# Patient Record
Sex: Female | Born: 2005 | Race: White | Hispanic: No | Marital: Single | Smoking: Never smoker
Health system: Southern US, Community
[De-identification: ages and names within clinical notes are randomized; demographics above are authoritative.]

## PROBLEM LIST (undated history)

## (undated) DIAGNOSIS — J302 Other seasonal allergic rhinitis: Secondary | ICD-10-CM

## (undated) DIAGNOSIS — S52202A Unspecified fracture of shaft of left ulna, initial encounter for closed fracture: Secondary | ICD-10-CM

## (undated) DIAGNOSIS — Z8489 Family history of other specified conditions: Secondary | ICD-10-CM

## (undated) DIAGNOSIS — J189 Pneumonia, unspecified organism: Secondary | ICD-10-CM

## (undated) DIAGNOSIS — Z9889 Other specified postprocedural states: Secondary | ICD-10-CM

## (undated) DIAGNOSIS — F419 Anxiety disorder, unspecified: Secondary | ICD-10-CM

## (undated) DIAGNOSIS — R112 Nausea with vomiting, unspecified: Secondary | ICD-10-CM

## (undated) HISTORY — PX: TONSILLECTOMY: SUR1361

## (undated) HISTORY — PX: MOUTH SURGERY: SHX715

---
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
## 2024-01-21 ENCOUNTER — Ambulatory Visit: Admitting: Physician Assistant

## 2024-01-21 ENCOUNTER — Other Ambulatory Visit: Payer: Self-pay

## 2024-01-21 ENCOUNTER — Ambulatory Visit (INDEPENDENT_AMBULATORY_CARE_PROVIDER_SITE_OTHER)
Admission: RE | Admit: 2024-01-21 | Discharge: 2024-01-21 | Disposition: A | Discharge status: Home / Self Care | Payer: Self-pay | Source: Ambulatory Visit | Attending: Physician Assistant | Admitting: Physician Assistant

## 2024-01-21 ENCOUNTER — Encounter: Payer: Self-pay | Admitting: Physician Assistant

## 2024-01-21 VITALS — BP 100/68 | HR 64 | Ht 63.0 in | Wt 104.0 lb

## 2024-01-21 DIAGNOSIS — G8929 Other chronic pain: Secondary | ICD-10-CM | POA: Diagnosis not present

## 2024-01-21 DIAGNOSIS — R112 Nausea with vomiting, unspecified: Secondary | ICD-10-CM | POA: Diagnosis not present

## 2024-01-21 DIAGNOSIS — R109 Unspecified abdominal pain: Secondary | ICD-10-CM | POA: Diagnosis not present

## 2024-01-21 DIAGNOSIS — R110 Nausea: Secondary | ICD-10-CM | POA: Diagnosis not present

## 2024-01-21 DIAGNOSIS — R1013 Epigastric pain: Secondary | ICD-10-CM

## 2024-01-21 MED ORDER — HYOSCYAMINE SULFATE 0.125 MG SL SUBL: 0.1250 mg | SUBLINGUAL_TABLET | Freq: Four times a day (QID) | SUBLINGUAL | 1 refills | Status: DC | PRN

## 2024-01-21 MED ORDER — ONDANSETRON HCL 4 MG PO TABS: 4.0000 mg | ORAL_TABLET | Freq: Three times a day (TID) | ORAL | 1 refills | Status: DC | PRN

## 2024-01-21 MED ORDER — OMEPRAZOLE 40 MG PO CPDR: 40.0000 mg | DELAYED_RELEASE_CAPSULE | Freq: Every day | ORAL | 0 refills | Status: DC

## 2024-01-21 NOTE — Patient Instructions (Addendum)
 Your provider has requested that you go to the basement level for lab work before leaving today. Press B on the elevator. The lab is located at the first door on the left as you exit the elevator.  Either before or after the lab - go to [facility] to have an xray done.  We are ordering a Diatherix stool testing for you to take home and complete.  You have received a kit from our office today containing all necessary supplies to complete this test.  Please carefully read the stool collection instructions provided in the kit before opening the accompanying materials  OR an easier way is to please use toilet paper to wipe after your bowel movement and use the qtip/applicator provided to get a small volume of the stool from the toilet paper and place that in the tube.   Important to remember: -Place the label onto the puritan opti-swab TUBE. -This label should include your full name and date of birth.  - This label should have the DATE AND TIME stool was collects -After completing the test, you should secure the tube into the specimen biohazard bag.  -The laboratory request information sheet (including date and time of specimen collection) should be placed into the outside pocket of the specimen biohazard bag and returned to the [place] lab with 2 days of collection.   If it is greater than two days from collection you will be asked to repeat the test.  If the label is missing from the tube with your name, date of birth, date and time of collection on it, you will have to repeat the test.  Any questions please message us  on my chart or call the office at [phone]   Your provider has requested that you have an abdominal x ray before leaving today. Please go to the basement floor to our Radiology department for the test.  Please take your proton pump inhibitor medication, omeprazole  40 mg  Please take this medication 30 minutes to 1 hour before meals- this makes it more effective.  Avoid  spicy and acidic foods Avoid fatty foods Limit your intake of coffee, tea, alcohol, and carbonated drinks Work to maintain a healthy weight Keep the head of the bed elevated at least 3 inches with blocks or a wedge pillow if you are having any nighttime symptoms Stay upright for 2 hours after eating Avoid meals and snacks three to four hours before bedtime  First do a trial off milk/lactose products if you use them.  Add fiber like benefiber or citracel once a day Increase activity Can do trial of IBGard which is over the counter for AB pain- Take 1-2 capsules once a day for maintence or twice a day during a flare Can send in an anti spasm medication, Levsin , to take as needed for AB pain    FODMAP stands for fermentable oligo-, di-, mono-saccharides and polyols (1). These are the scientific terms used to classify groups of carbs that are difficult for our body to digest and that are notorious for triggering digestive symptoms like bloating, gas, loose stools and stomach pain.   You can try low FODMAP diet  - start with eliminating just one column at a time that you feel may be a trigger for you. - the table at the very bottom contains foods that are low in FODMAPs   Sometimes trying to eliminate the FODMAP's from your diet is difficult or tricky, if you are stuggling with trying to do  the elimination diet you can try an enzyme.  There is a food enzymes that you sprinkle in or on your food that helps break down the FODMAP. You can read more about the enzyme by going to this site: https://fodzyme.com/

## 2024-01-21 NOTE — Progress Notes (Signed)
 01/21/2024 Angelica Campos [id number] 01-Sep-2005  Referring provider: Joaquim Lorenza NOVAK, MD Primary GI doctor: Dr. Suzann  ASSESSMENT AND PLAN:  Epigastric pain  progressively over the last year, nausea with some vomiting stomach acid, worse in the morning, decreased appetite, no fever, chills, melena, change in bowel habits 10/2022 unremarkable CBC, kidney liver, sed rate CRP, thyroid -Get Sed rate, CRP, celiac panel -Check H. Pylori Diatherix -Get lipase - KUB -Will schedule for AB US  -Pending labs and response to medication, can consider endoscopic evaluation -Consider SIBO testing or xifaxin trial pending results -Can do trial of IBGARD daily, will give Levsin  -FODMAP,  and lifestyle changes discussed - close follow up 4-6 weeks  Patient Care Team: Dial, Lorenza NOVAK, MD as PCP - General (Pediatrics)   HISTORY OF PRESENT ILLNESS: 18 y.o. female with a past medical history listed below presents for evaluation of abdominal pain.   Discussed the use of AI scribe software for clinical note transcription with the patient, who gave verbal consent to proceed.  The patient presents with a year-long history of worsening abdominal pain and nausea.  The patient has experienced abdominal pain for the past year, which has progressively worsened, particularly in the last few days. The pain is located under the rib cage, above the belly button, and does not radiate. It is described as sharp and uncomfortable, often waking them up in the early morning hours, around 4 AM. The pain is alleviated by sitting up or lying on their back, but worsens when lying on their sides.  Associated symptoms include nausea and vomiting, primarily in the morning, often vomiting stomach acid. They report a decreased appetite, particularly in the morning, and feel better after eating in the afternoon, although they find food uninteresting. They are unsure about recent weight changes, as they do not have a recent weight  measurement to confirm this. No fever, chills, changes in bowel habits, or blood in the stool. They have regular bowel movements, occurring daily or every other day, without straining or loose stools. They do not experience acid reflux symptoms such as burning or acid sensation in the esophagus, but they do have sharp abdominal pain.  Their medication use includes taking ibuprofen  for headaches every two to four days, usually three tablets at a time. They have tried over-the-counter Pepcid, which was ineffective, but report that omeprazole  40 mg helps alleviate their symptoms. They have also used Zofran  for nausea.  They work as a Production assistant, radio at [employer] and typically eat late at night after work. They do not smoke or consume alcohol. There is no history of migraines or vertigo, and they deny any chance of pregnancy.   She  reports that she has never smoked. She has never used smokeless tobacco. She reports that she does not drink alcohol and does not use drugs.  RELEVANT GI HISTORY, IMAGING AND LABS: Results          CBC No results found for: WBC, RBC, HGB, HCT, PLT, MCV, MCH, MCHC, RDW, LYMPHSABS, MONOABS, EOSABS, BASOSABS No results for input(s): HGB in the last 8760 hours.  CMP  No results found for: NA, K, CL, CO2, GLUCOSE, BUN, CREATININE, CALCIUM, PROT, ALBUMIN, AST, ALT, ALKPHOS, BILITOT, GFRNONAA, GFRAA     No data to display            Current Medications:   Current Outpatient Medications (Endocrine & Metabolic):    drospirenone-ethinyl estradiol (YAZ) 3-0.02 MG tablet, Take 1 tablet by mouth daily.  Current Outpatient Medications (Analgesics):    ibuprofen  (ADVIL ,MOTRIN ) 400 MG tablet, Take 400 mg by mouth 3 (three) times daily.   Current Outpatient Medications (Other):    escitalopram (LEXAPRO) 10 MG tablet, Take 1 tablet by mouth daily.   hyoscyamine  (LEVSIN  SL) 0.125 MG SL tablet, Place 1 tablet (0.125 mg  total) under the tongue every 6 (six) hours as needed for cramping (nausea).   omeprazole  (PRILOSEC) 40 MG capsule, Take 1 capsule (40 mg total) by mouth daily before breakfast.   ondansetron  (ZOFRAN ) 4 MG tablet, Take 1 tablet (4 mg total) by mouth every 8 (eight) hours as needed for nausea or vomiting.  Medical History:  Past Medical History:  Diagnosis Date   Anxiety    Family history of adverse reaction to anesthesia    Mother had difficuly urinating after surgery as well PONV   Left ulnar fracture    Pneumonia    PONV (postoperative nausea and vomiting)    Seasonal allergies    Allergies: No Known Allergies   Surgical History:  She  has a past surgical history that includes Tonsillectomy; Mouth surgery; and ORIF ulnar fracture (Left, 07/29/2016). Family History:  Her family history includes Arthritis in her maternal grandfather and mother.  REVIEW OF SYSTEMS  : All other systems reviewed and negative except where noted in the History of Present Illness.  PHYSICAL EXAM: BP 100/68   Pulse 64   Ht 5' 3 (1.6 m)   Wt 104 lb (47.2 kg)   BMI 18.42 kg/m  GENERAL APPEARANCE: Well nourished, in no apparent distress. HEENT: No cervical lymphadenopathy, unremarkable thyroid, sclerae anicteric, conjunctiva pink. RESPIRATORY: Respiratory effort normal, BS equal bilateral without rales, rhonchi, wheezing. CARDIO: RRR with no MRGs, peripheral pulses intact. ABDOMEN: Soft, non distended, active bowel sounds in all 4 quadrants, no tenderness to palpation, no rebound, no mass appreciated. Abdominal pain localized below rib cage, no upper abdominal tenderness. RECTAL: Declines. MUSCULOSKELETAL: Full ROM, normal gait, without edema. SKIN: Dry, intact without rashes or lesions. No jaundice. NEURO: Alert, oriented, no focal deficits. PSYCH: Cooperative, normal mood and affect. Alan JONELLE Coombs, PA-C 2:48 PM

## 2024-01-23 ENCOUNTER — Ambulatory Visit: Payer: Self-pay | Admitting: Physician Assistant

## 2024-01-28 ENCOUNTER — Ambulatory Visit (HOSPITAL_COMMUNITY)
Admission: RE | Admit: 2024-01-28 | Discharge: 2024-01-28 | Disposition: A | Discharge status: Home / Self Care | Source: Ambulatory Visit | Attending: Physician Assistant | Admitting: Physician Assistant

## 2024-01-28 ENCOUNTER — Other Ambulatory Visit (INDEPENDENT_AMBULATORY_CARE_PROVIDER_SITE_OTHER)

## 2024-01-28 ENCOUNTER — Telehealth: Payer: Self-pay

## 2024-01-28 ENCOUNTER — Other Ambulatory Visit: Payer: Self-pay

## 2024-01-28 ENCOUNTER — Ambulatory Visit: Payer: Self-pay | Admitting: Physician Assistant

## 2024-01-28 DIAGNOSIS — R112 Nausea with vomiting, unspecified: Secondary | ICD-10-CM

## 2024-01-28 DIAGNOSIS — R109 Unspecified abdominal pain: Secondary | ICD-10-CM | POA: Diagnosis not present

## 2024-01-28 DIAGNOSIS — R1013 Epigastric pain: Secondary | ICD-10-CM | POA: Insufficient documentation

## 2024-01-28 DIAGNOSIS — E162 Hypoglycemia, unspecified: Secondary | ICD-10-CM | POA: Diagnosis not present

## 2024-01-28 DIAGNOSIS — R110 Nausea: Secondary | ICD-10-CM | POA: Diagnosis not present

## 2024-01-28 LAB — COMPREHENSIVE METABOLIC PANEL WITH GFR
ALT: 13 U/L (ref 0–35)
AST: 22 U/L (ref 0–37)
Albumin: 4.8 g/dL (ref 3.5–5.2)
Alkaline Phosphatase: 51 U/L (ref 47–119)
BUN: 15 mg/dL (ref 6–23)
CO2: 26 meq/L (ref 19–32)
Calcium: 10.1 mg/dL (ref 8.4–10.5)
Chloride: 103 meq/L (ref 96–112)
Creatinine, Ser: 0.79 mg/dL (ref 0.40–1.20)
GFR: 108.43 mL/min (ref >60.00)
Glucose, Bld: 45 mg/dL — CL (ref 70–99)
Potassium: 3.8 meq/L (ref 3.5–5.1)
Sodium: 138 meq/L (ref 135–145)
Total Bilirubin: 0.4 mg/dL (ref 0.2–1.2)
Total Protein: 7.9 g/dL (ref 6.0–8.3)

## 2024-01-28 LAB — CBC WITH DIFFERENTIAL/PLATELET
Basophils Absolute: 0.1 K/uL (ref 0.0–0.1)
Basophils Relative: 1.4 % (ref 0.0–3.0)
Eosinophils Absolute: 0.1 K/uL (ref 0.0–0.7)
Eosinophils Relative: 2.4 % (ref 0.0–5.0)
HCT: 43.5 % (ref 36.0–49.0)
Hemoglobin: 14.4 g/dL (ref 12.0–16.0)
Lymphocytes Relative: 31.6 % (ref 24.0–48.0)
Lymphs Abs: 1.5 K/uL (ref 0.7–4.0)
MCHC: 33.1 g/dL (ref 31.0–37.0)
MCV: 83.1 fl (ref 78.0–98.0)
Monocytes Absolute: 0.6 K/uL (ref 0.1–1.0)
Monocytes Relative: 11.5 % (ref 3.0–12.0)
Neutro Abs: 2.6 K/uL (ref 1.4–7.7)
Neutrophils Relative %: 53.1 % (ref 43.0–71.0)
Platelets: 288 K/uL (ref 150.0–575.0)
RBC: 5.24 Mil/uL (ref 3.80–5.70)
RDW: 13.5 % (ref 11.4–15.5)
WBC: 4.8 K/uL (ref 4.5–13.5)

## 2024-01-28 LAB — LIPASE: Lipase: 45 U/L (ref 11.0–59.0)

## 2024-01-28 LAB — C-REACTIVE PROTEIN: CRP: <1 mg/dL (ref 0.5–20.0)

## 2024-01-28 LAB — SEDIMENTATION RATE: Sed Rate: 11 mm/h (ref 0–20)

## 2024-01-28 LAB — HEMOGLOBIN A1C: Hgb A1c MFr Bld: 5.7 % (ref 4.6–6.5)

## 2024-01-28 NOTE — Telephone Encounter (Signed)
 Spoke with Pt Mother Berwyn  about recent lab results . Pt mom stated that she was really concerned due to the fact that last week during her lab draw that she became cold and clammy, hands curled up and became stiff and passed out. Berwyn stated that the pt had a small meal and orange juice before the lab draw this morning.  Pt mom Berwyn stated that today the pt  became very cold,  clammy and sweaty during the lab draw.   Orders received from [facility] PA to add on A1C, Random Insulin  and TTG. Orders placed in Epic.  Darice from the lab made aware and stated that she would notify me if they were not able to add on the labs.  Routed as FYI

## 2024-01-28 NOTE — Telephone Encounter (Signed)
 Hope from lab said that patient's glucose level was 45 at 8:57am. Please advise

## 2024-01-29 LAB — INSULIN, RANDOM: Insulin: 14.9 u[IU]/mL

## 2024-01-29 LAB — TISSUE TRANSGLUTAMINASE ABS,IGG,IGA

## 2024-01-31 LAB — IGA: Immunoglobulin A: 78 mg/dL (ref 47–310)

## 2024-02-01 DIAGNOSIS — E162 Hypoglycemia, unspecified: Secondary | ICD-10-CM | POA: Diagnosis not present

## 2024-02-01 DIAGNOSIS — Z68.41 Body mass index (BMI) pediatric, 5th percentile to less than 85th percentile for age: Secondary | ICD-10-CM | POA: Diagnosis not present

## 2024-02-01 DIAGNOSIS — R1013 Epigastric pain: Secondary | ICD-10-CM | POA: Diagnosis not present

## 2024-02-05 ENCOUNTER — Other Ambulatory Visit

## 2024-02-12 ENCOUNTER — Telehealth: Payer: Self-pay | Admitting: Physician Assistant

## 2024-02-15 DIAGNOSIS — R1013 Epigastric pain: Secondary | ICD-10-CM | POA: Diagnosis not present

## 2024-02-15 DIAGNOSIS — E162 Hypoglycemia, unspecified: Secondary | ICD-10-CM | POA: Diagnosis not present

## 2024-02-20 ENCOUNTER — Ambulatory Visit: Payer: Self-pay | Admitting: Physician Assistant

## 2024-02-20 ENCOUNTER — Encounter: Payer: Self-pay | Admitting: Physician Assistant

## 2024-02-20 NOTE — Progress Notes (Deleted)
 02/20/2024 Angelica Campos [id number] 08-20-2005  Referring provider: Joaquim Lorenza NOVAK, MD Primary GI doctor: Dr. Suzann  ASSESSMENT AND PLAN:  Epigastric pain  progressively over the last year, nausea with some vomiting stomach acid, worse in the morning, decreased appetite, no fever, chills, melena, change in bowel habits 10/2022 unremarkable CBC, kidney liver, sed rate CRP, thyroid -Get Sed rate, CRP, celiac panel -Check H. Pylori Diatherix -Get lipase - KUB -Will schedule for AB US  -Pending labs and response to medication, can consider endoscopic evaluation -Consider SIBO testing or xifaxin trial pending results -Can do trial of IBGARD daily, will give Levsin  -FODMAP,  and lifestyle changes discussed - close follow up 4-6 weeks  Hypoglycemia Following with endocrine Consider fasting glucose test, rule out insulinoma  I have reviewed the clinic note as outlined by Angelica Coombs, PA and agree with the assessment, plan and medical decision making.  Angelica Campos presents to the office for evaluation of epigastric abdominal pain, nausea and vomiting.  No other alarm features.  Agree with laboratory testing, KUB and abdominal ultrasound.  Reasonable to trial omeprazole , hyoscyamine  and dietary modification.  If symptoms are not improving and above workup is unremarkable can reevaluate possibility of EGD.  Angelica Campos Suzann, MD   Patient Care Team: College, [facility] [place] as PCP - General (Family Medicine)   HISTORY OF PRESENT ILLNESS: 18 y.o. female with a past medical history listed below presents for evaluation of abdominal pain.   Discussed the use of AI scribe software for clinical note transcription with the patient, who gave verbal consent to proceed.  The patient presents with a year-long history of worsening abdominal pain and nausea.  The patient has experienced abdominal pain for the past year, which has progressively worsened, particularly in the last  few days. The pain is located under the rib cage, above the belly button, and does not radiate. It is described as sharp and uncomfortable, often waking them up in the early morning hours, around 4 AM. The pain is alleviated by sitting up or lying on their back, but worsens when lying on their sides.  Associated symptoms include nausea and vomiting, primarily in the morning, often vomiting stomach acid. They report a decreased appetite, particularly in the morning, and feel better after eating in the afternoon, although they find food uninteresting. They are unsure about recent weight changes, as they do not have a recent weight measurement to confirm this. No fever, chills, changes in bowel habits, or blood in the stool. They have regular bowel movements, occurring daily or every other day, without straining or loose stools. They do not experience acid reflux symptoms such as burning or acid sensation in the esophagus, but they do have sharp abdominal pain.  Their medication use includes taking ibuprofen  for headaches every two to four days, usually three tablets at a time. They have tried over-the-counter Pepcid, which was ineffective, but report that omeprazole  40 mg helps alleviate their symptoms. They have also used Zofran  for nausea.  They work as a Production assistant, radio at [employer] and typically eat late at night after work. They do not smoke or consume alcohol. There is no history of migraines or vertigo, and they deny any chance of pregnancy.   She  reports that she has never smoked. She has never used smokeless tobacco. She reports that she does not drink alcohol and does not use drugs.  RELEVANT GI HISTORY, IMAGING AND LABS: Results  CBC    Component Value Date/Time   WBC 4.8 01/28/2024 0857   RBC 5.24 01/28/2024 0857   HGB 14.4 01/28/2024 0857   HCT 43.5 01/28/2024 0857   PLT 288.0 01/28/2024 0857   MCV 83.1 01/28/2024 0857   MCHC 33.1 01/28/2024 0857   RDW 13.5 01/28/2024 0857    LYMPHSABS 1.5 01/28/2024 0857   MONOABS 0.6 01/28/2024 0857   EOSABS 0.1 01/28/2024 0857   BASOSABS 0.1 01/28/2024 0857   Recent Labs    01/28/24 0857  HGB 14.4    CMP     Component Value Date/Time   NA 138 01/28/2024 0857   K 3.8 01/28/2024 0857   CL 103 01/28/2024 0857   CO2 26 01/28/2024 0857   GLUCOSE 45 (LL) 01/28/2024 0857   BUN 15 01/28/2024 0857   CREATININE 0.79 01/28/2024 0857   CALCIUM 10.1 01/28/2024 0857   PROT 7.9 01/28/2024 0857   ALBUMIN 4.8 01/28/2024 0857   AST 22 01/28/2024 0857   ALT 13 01/28/2024 0857   ALKPHOS 51 01/28/2024 0857   BILITOT 0.4 01/28/2024 0857      Latest Ref Rng & Units 01/28/2024    8:57 AM  Hepatic Function  Total Protein 6.0 - 8.3 g/dL 7.9   Albumin 3.5 - 5.2 g/dL 4.8   AST 0 - 37 U/L 22   ALT 0 - 35 U/L 13   Alk Phosphatase 47 - 119 U/L 51   Total Bilirubin 0.2 - 1.2 mg/dL 0.4       Current Medications:   Current Outpatient Medications (Endocrine & Metabolic):    drospirenone-ethinyl estradiol (YAZ) 3-0.02 MG tablet, Take 1 tablet by mouth daily.    Current Outpatient Medications (Analgesics):    ibuprofen  (ADVIL ,MOTRIN ) 400 MG tablet, Take 400 mg by mouth 3 (three) times daily.   Current Outpatient Medications (Other):    escitalopram (LEXAPRO) 10 MG tablet, Take 1 tablet by mouth daily.   hyoscyamine  (LEVSIN  SL) 0.125 MG SL tablet, Place 1 tablet (0.125 mg total) under the tongue every 6 (six) hours as needed for cramping (nausea).   omeprazole  (PRILOSEC) 40 MG capsule, Take 1 capsule (40 mg total) by mouth daily before breakfast.   ondansetron  (ZOFRAN ) 4 MG tablet, Take 1 tablet (4 mg total) by mouth every 8 (eight) hours as needed for nausea or vomiting.  Medical History:  Past Medical History:  Diagnosis Date   Anxiety    Family history of adverse reaction to anesthesia    Mother had difficuly urinating after surgery as well PONV   Left ulnar fracture    Pneumonia    PONV (postoperative nausea and  vomiting)    Seasonal allergies    Allergies: No Known Allergies   Surgical History:  She  has a past surgical history that includes Tonsillectomy; Mouth surgery; and ORIF ulnar fracture (Left, 07/29/2016). Family History:  Her family history includes Arthritis in her maternal grandfather and mother.  REVIEW OF SYSTEMS  : All other systems reviewed and negative except where noted in the History of Present Illness.  PHYSICAL EXAM: LMP 01/21/2024  GENERAL APPEARANCE: Well nourished, in no apparent distress. HEENT: No cervical lymphadenopathy, unremarkable thyroid, sclerae anicteric, conjunctiva pink. RESPIRATORY: Respiratory effort normal, BS equal bilateral without rales, rhonchi, wheezing. CARDIO: RRR with no MRGs, peripheral pulses intact. ABDOMEN: Soft, non distended, active bowel sounds in all 4 quadrants, no tenderness to palpation, no rebound, no mass appreciated. Abdominal pain localized below rib cage, no upper abdominal tenderness. RECTAL: Declines.  MUSCULOSKELETAL: Full ROM, normal gait, without edema. SKIN: Dry, intact without rashes or lesions. No jaundice. NEURO: Alert, oriented, no focal deficits. PSYCH: Cooperative, normal mood and affect. Angelica JONELLE Coombs, PA-C 7:38 AM

## 2024-04-07 DIAGNOSIS — L910 Hypertrophic scar: Secondary | ICD-10-CM | POA: Diagnosis not present

## 2024-04-07 DIAGNOSIS — L700 Acne vulgaris: Secondary | ICD-10-CM | POA: Diagnosis not present

## 2024-04-08 ENCOUNTER — Encounter: Payer: Self-pay | Admitting: Physician Assistant

## 2024-04-08 ENCOUNTER — Ambulatory Visit: Admitting: Physician Assistant

## 2024-04-08 VITALS — BP 112/80 | HR 58 | Ht 63.0 in | Wt 106.2 lb

## 2024-04-08 DIAGNOSIS — R112 Nausea with vomiting, unspecified: Secondary | ICD-10-CM | POA: Diagnosis not present

## 2024-04-08 DIAGNOSIS — R1013 Epigastric pain: Secondary | ICD-10-CM | POA: Diagnosis not present

## 2024-04-08 DIAGNOSIS — R630 Anorexia: Secondary | ICD-10-CM

## 2024-04-08 DIAGNOSIS — E162 Hypoglycemia, unspecified: Secondary | ICD-10-CM

## 2024-04-08 MED ORDER — METOCLOPRAMIDE HCL 10 MG PO TABS: 10.0000 mg | ORAL_TABLET | Freq: Four times a day (QID) | ORAL | 0 refills | Status: AC | PRN

## 2024-04-08 NOTE — Progress Notes (Signed)
 04/08/2024 Vernell Pfeiffer [id number] 07/11/2005  Referring provider: [facility] Primary GI doctor: Dr. Suzann  ASSESSMENT AND PLAN:  Epigastric pain  progressively over the last year, nausea with some vomiting stomach acid, worse in the morning, decreased appetite, no fever, chills, melena, change in bowel habits 01/21/2024 KUB small volume stool in the colon moderately distended stomach with ingested material 01/28/2024 ABUS unremarkable 01/28/2024 unremarkable celiac, CRP sed rate, lipase 02/20/2024 H. pylori negative Diatherix Given trial of omeprazole  40 mg once daily Patient states she is about 80% better with her symptoms, we discussed doing EGD/GES study but she needs to talk with her mom - trial of reglan for possible GES -Consider EGD if symptoms continue or worsen -Consider gastroparesis study, continue gastroparesis diet -Consider HIDA but no AB pain, not after food -Consider cortisol but no low blood pressure,  -Consider SIBO testing or xifaxin trial pending results -Can do trial of FDGard daily, will give Levsin  -FODMAP,  and lifestyle changes discussed - close follow up 4-6 weeks   Wt Readings from Last 3 Encounters:  04/08/24 106 lb 4 oz (48.2 kg) (10%, Z= -1.29)*  01/21/24 104 lb (47.2 kg) (7%, Z= -1.45)*  08/11/23 100 lb (45.4 kg) (4%, Z= -1.76)*   * Growth percentiles are based on CDC (Girls, 2-20 Years) data.    Patient Care Team: College, [facility] [place] as PCP - General (Family Medicine)   HISTORY OF PRESENT ILLNESS: 18 y.o. female with a past medical history listed below presents for evaluation of abdominal pain.   I last saw the patient in the office on 01/21/2024 for Epigastric pain.   States she has been doing well with the omeprazole  but continues to have nausea and epigastric pain.  Nausea is normally in the morning or later in the day, around 5 PM. 80 % of the time she is fine. Not associated with food.  Denies  GERD, vomiting, dysphgaia. No ETOH. Epigastric pain baseline constant, not associated with the nausea. Not worse with food. No radiation. Negative carrnett on exam.  Denies diarrhea, constipation, melena, hematochezia. Moving in with your boyfriend in [place].   She  reports that she has never smoked. She has never used smokeless tobacco. She reports that she does not drink alcohol and does not use drugs.  RELEVANT GI HISTORY, IMAGING AND LABS: Results          CBC    Component Value Date/Time   WBC 4.8 01/28/2024 0857   RBC 5.24 01/28/2024 0857   HGB 14.4 01/28/2024 0857   HCT 43.5 01/28/2024 0857   PLT 288.0 01/28/2024 0857   MCV 83.1 01/28/2024 0857   MCHC 33.1 01/28/2024 0857   RDW 13.5 01/28/2024 0857   LYMPHSABS 1.5 01/28/2024 0857   MONOABS 0.6 01/28/2024 0857   EOSABS 0.1 01/28/2024 0857   BASOSABS 0.1 01/28/2024 0857   Recent Labs    01/28/24 0857  HGB 14.4    CMP     Component Value Date/Time   NA 138 01/28/2024 0857   K 3.8 01/28/2024 0857   CL 103 01/28/2024 0857   CO2 26 01/28/2024 0857   GLUCOSE 45 (LL) 01/28/2024 0857   BUN 15 01/28/2024 0857   CREATININE 0.79 01/28/2024 0857   CALCIUM 10.1 01/28/2024 0857   PROT 7.9 01/28/2024 0857   ALBUMIN 4.8 01/28/2024 0857   AST 22 01/28/2024 0857   ALT 13 01/28/2024 0857   ALKPHOS 51 01/28/2024 0857   BILITOT  0.4 01/28/2024 0857      Latest Ref Rng & Units 01/28/2024    8:57 AM  Hepatic Function  Total Protein 6.0 - 8.3 g/dL 7.9   Albumin 3.5 - 5.2 g/dL 4.8   AST 0 - 37 U/L 22   ALT 0 - 35 U/L 13   Alk Phosphatase 47 - 119 U/L 51   Total Bilirubin 0.2 - 1.2 mg/dL 0.4       Current Medications:   Current Outpatient Medications (Endocrine & Metabolic):    drospirenone-ethinyl estradiol (YAZ) 3-0.02 MG tablet, Take 1 tablet by mouth daily.    Current Outpatient Medications (Analgesics):    ibuprofen  (ADVIL ,MOTRIN ) 400 MG tablet, Take 400 mg by mouth 3 (three) times daily.   Current  Outpatient Medications (Other):    escitalopram (LEXAPRO) 10 MG tablet, Take 1 tablet by mouth daily.   hyoscyamine  (LEVSIN  SL) 0.125 MG SL tablet, Place 1 tablet (0.125 mg total) under the tongue every 6 (six) hours as needed for cramping (nausea).   omeprazole  (PRILOSEC) 40 MG capsule, Take 1 capsule (40 mg total) by mouth daily before breakfast.   ondansetron  (ZOFRAN ) 4 MG tablet, Take 1 tablet (4 mg total) by mouth every 8 (eight) hours as needed for nausea or vomiting.  Medical History:  Past Medical History:  Diagnosis Date   Anxiety    Family history of adverse reaction to anesthesia    Mother had difficuly urinating after surgery as well PONV   Left ulnar fracture    Pneumonia    PONV (postoperative nausea and vomiting)    Seasonal allergies    Allergies: No Known Allergies   Surgical History:  She  has a past surgical history that includes Tonsillectomy; Mouth surgery; and ORIF ulnar fracture (Left, 07/29/2016). Family History:  Her family history includes Arthritis in her maternal grandfather and mother.  REVIEW OF SYSTEMS  : All other systems reviewed and negative except where noted in the History of Present Illness.  PHYSICAL EXAM: There were no vitals taken for this visit. GENERAL APPEARANCE: Well nourished, in no apparent distress. HEENT: No cervical lymphadenopathy, unremarkable thyroid, sclerae anicteric, conjunctiva pink. RESPIRATORY: Respiratory effort normal, BS equal bilateral without rales, rhonchi, wheezing. CARDIO: RRR with no MRGs, peripheral pulses intact. ABDOMEN: Soft, non distended, active bowel sounds in all 4 quadrants, no tenderness to palpation, no rebound, no mass appreciated. Abdominal pain localized below rib cage, no upper abdominal tenderness. RECTAL: Declines. MUSCULOSKELETAL: Full ROM, normal gait, without edema. SKIN: Dry, intact without rashes or lesions. No jaundice. NEURO: Alert, oriented, no focal deficits. PSYCH: Cooperative, normal  mood and affect.  Alan JONELLE Coombs, PA-C 7:38 AM

## 2024-04-08 NOTE — Patient Instructions (Addendum)
 Reglan or metoclopramide  Can be used for gastroparesis or slow stomach, nausea, vomiting, GERD.   Continue the medication as needed up to 2 times a day, on an empty stomach 30 minutes before eating. It may take a few weeks for your stomach condition to start to get better. However, do not take this medicine for longer than 12 weeks.  The longer you take this medicine, and the more you take it, the greater your chances are of developing serious side effects.  Some people may get a severe muscle problem called tardive dyskinesia. This problem may lessen or go away after stopping this drug, but it may not go away. The risk is greater with diabetes and in older adults, especially older females. The risk is greater with longer use or higher doses, but it may also occur after short-term use with low doses. Call your doctor right away if you have trouble controlling body movements or problems with your tongue, face, mouth, or jaw like tongue sticking out, puffing cheeks, mouth puckering, or chewing.   Please monitor for worsening depression or thoughts of suicide, any aggressiveness or hyperactivity.  If this happen please stop and call your physician right away.   Gastroparesis Gastroparesis is a condition in which food takes longer than normal to empty from the stomach.  This condition is also known as delayed gastric emptying. It is usually a long-term (chronic) condition.  What are the signs or symptoms? Symptoms of this condition include: Feeling full after eating very little or a loss of appetite. Nausea, vomiting, or heartburn. Bloating of your abdomen. Inconsistent blood sugar (glucose) levels on blood tests. Unexplained weight loss. Acid from the stomach coming up into the esophagus (gastroesophageal reflux). Sudden tightening (spasm) of the stomach, which can be painful. Symptoms may come and go. Some people may not notice any symptoms.  What increases the risk? You are more likely to  develop this condition if: You have certain disorders or diseases. These may include: An endocrine disorder. An eating disorder. Amyloidosis. Scleroderma. Parkinson's disease. Multiple sclerosis. Cancer or infection of the stomach or the vagus nerve. You have had surgery on your stomach or vagus nerve. You take certain medicines. You are female.  Things you can do: Please do small frequent meals like 4-6 meals a day.  Eat and drink liquids at separate times.  Avoid high fiber foods, cook your vegetables, avoid high fat food.  Suggest spreading protein throughout the day (greek yogurt, glucerna, soft meat, milk, eggs) Choose soft foods that you can mash with a fork When you are more symptomatic, change to pureed foods foods and liquids.  Consider reading Living well with Gastroparesis by [facility] Check out this link to a diet online [email or web address].groupjournal.fr  Please follow up in February or March.  We will send you a reminder when it is time to schedule an appointment.   Thank you for entrusting me with your care and for choosing [facility], Alan Coombs, P.A.-C   _______________________________________________________  If your blood pressure at your visit was 140/90 or greater, please contact your primary care physician to follow up on this.  _______________________________________________________  If you are age 59 or older, your body mass index should be between 23-30. Your Body mass index is 18.82 kg/m. If this is out of the aforementioned range listed, please consider follow up with your Primary Care Provider.  If you are age 79 or younger, your body mass index should be between 19-25. Your Body mass index  is 18.82 kg/m. If this is out of the aformentioned range listed, please consider follow up with your Primary Care Provider.    ________________________________________________________  The [facility] providers would like to encourage you to use MYCHART to communicate with providers for non-urgent requests or questions.  Due to long hold times on the telephone, sending your provider a message by [facility] may be a faster and more efficient way to get a response.  Please allow 48 business hours for a response.  Please remember that this is for non-urgent requests.  _______________________________________________________  [facility] is using a team-based approach to care.  Your team is made up of your doctor and two to three APPS. Our APPS (Nurse Practitioners and Physician Assistants) work with your physician to ensure care continuity for you. They are fully qualified to address your health concerns and develop a treatment plan. They communicate directly with your gastroenterologist to care for you. Seeing the Advanced Practice Practitioners on your physician's team can help you by facilitating care more promptly, often allowing for earlier appointments, access to diagnostic testing, procedures, and other specialty referrals.

## 2024-04-27 DIAGNOSIS — R112 Nausea with vomiting, unspecified: Secondary | ICD-10-CM | POA: Diagnosis not present

## 2024-04-27 DIAGNOSIS — R1011 Right upper quadrant pain: Secondary | ICD-10-CM | POA: Diagnosis not present

## 2024-04-27 DIAGNOSIS — R1010 Upper abdominal pain, unspecified: Secondary | ICD-10-CM | POA: Diagnosis not present

## 2024-04-28 ENCOUNTER — Ambulatory Visit: Admitting: Gastroenterology

## 2024-04-28 ENCOUNTER — Other Ambulatory Visit: Payer: Self-pay

## 2024-04-28 ENCOUNTER — Encounter: Payer: Self-pay | Admitting: Gastroenterology

## 2024-04-28 ENCOUNTER — Telehealth: Payer: Self-pay | Admitting: Physician Assistant

## 2024-04-28 ENCOUNTER — Encounter: Payer: Self-pay | Admitting: Physician Assistant

## 2024-04-28 VITALS — BP 117/68 | HR 63 | Temp 98.4°F | Resp 12 | Ht 63.0 in | Wt 106.4 lb

## 2024-04-28 DIAGNOSIS — R1013 Epigastric pain: Secondary | ICD-10-CM

## 2024-04-28 DIAGNOSIS — R112 Nausea with vomiting, unspecified: Secondary | ICD-10-CM

## 2024-04-28 DIAGNOSIS — F419 Anxiety disorder, unspecified: Secondary | ICD-10-CM | POA: Diagnosis not present

## 2024-04-28 DIAGNOSIS — K449 Diaphragmatic hernia without obstruction or gangrene: Secondary | ICD-10-CM

## 2024-04-28 MED ORDER — ONDANSETRON 8 MG PO TBDP: 8.0000 mg | ORAL_TABLET | Freq: Three times a day (TID) | ORAL | 0 refills | Status: DC | PRN

## 2024-04-28 MED ORDER — DICYCLOMINE HCL 20 MG PO TABS: 20.0000 mg | ORAL_TABLET | Freq: Three times a day (TID) | ORAL | 0 refills | Status: DC | PRN

## 2024-04-28 MED ORDER — ONDANSETRON 8 MG PO TBDP: 8.0000 mg | ORAL_TABLET | Freq: Three times a day (TID) | ORAL | 0 refills | Status: AC | PRN

## 2024-04-28 MED ORDER — DICYCLOMINE HCL 20 MG PO TABS: 20.0000 mg | ORAL_TABLET | Freq: Three times a day (TID) | ORAL | 0 refills | Status: AC | PRN

## 2024-04-28 MED ORDER — SODIUM CHLORIDE 0.9 % IV SOLN: 500.0000 mL | INTRAVENOUS | Status: DC

## 2024-04-28 NOTE — Telephone Encounter (Signed)
 Patient known to myself for epigastric pain and nausea.  Responded well to omeprazole  40 mg twice daily. Had unremarkable abdominal ultrasound. Had x-ray unremarkable other than moderately distended stomach. Worsening nausea and upper abdominal pain with radiation to her back with chills but no fever for the last several days. Labs showed very slight left shift neutrophils but normal WBC RBC and platelets.  Normal kidney liver.  Negative lipase.  Mother and father both with history of biliary dyskinesia status post cholecystectomy. Patient without reflux just dull epigastric pain. Had not had a bowel movement for 5 days.  Has been taking Zofran  sublingual did Dulcolax suppository with bowel movements but no help and discomfort. Denies melena or hematochezia. Occasional ibuprofen  use for abdominal pain. On examination patient did have some positive Carbonet and tried lidocaine  patches.  Which were not helpful.  Will plan for urgent EGD and HIDA scan for symptoms, consider CT AB and pelvis.   Patient currently NPO as of this AM, had water at 6 AM.  Continue Prilosec 40 mg twice daily.    Zofran  sublingual every 4-6 hours 8 mg #30 no refills.    dicyclomine  20 mg every 8 hours for abdominal pain #50 no refills. Sent to pharmacy  ER precautions discussed.  Patient symptoms still very reminiscent of gastroparesis especially with x-ray showing moderately distended stomach, reiterated gastroparesis diet, continue Reglan  as needed. If everything is negative consider GES.

## 2024-04-28 NOTE — Patient Instructions (Addendum)
 Resume previous diet.                           - Continue present medications.                           - Await pathology results.                           - Follow an antireflux regimen.                           - Follow up in GI office as scheduled    YOU HAD AN ENDOSCOPIC PROCEDURE TODAY AT THE [facility]:   Refer to the procedure report that was given to you for any specific questions about what was found during the examination.  If the procedure report does not answer your questions, please call your gastroenterologist to clarify.  If you requested that your care partner not be given the details of your procedure findings, then the procedure report has been included in a sealed envelope for you to review at your convenience later.  YOU SHOULD EXPECT: Some feelings of bloating in the abdomen. Passage of more gas than usual.  Walking can help get rid of the air that was put into your GI tract during the procedure and reduce the bloating. If you had a lower endoscopy (such as a colonoscopy or flexible sigmoidoscopy) you may notice spotting of blood in your stool or on the toilet paper. If you underwent a bowel prep for your procedure, you may not have a normal bowel movement for a few days.  Please Note:  You might notice some irritation and congestion in your nose or some drainage.  This is from the oxygen used during your procedure.  There is no need for concern and it should clear up in a day or so.  SYMPTOMS TO REPORT IMMEDIATELY:   Following upper endoscopy (EGD)  Vomiting of blood or coffee ground material  New chest pain or pain under the shoulder blades  Painful or persistently difficult swallowing  New shortness of breath  Fever of 100F or higher  Black, tarry-looking stools  For urgent or emergent issues, a gastroenterologist can be reached at any hour by calling [phone]. Do not use MyChart messaging for urgent concerns.    DIET:  We do recommend a  small meal at first, but then you may proceed to your regular diet.  Drink plenty of fluids but you should avoid alcoholic beverages for 24 hours.  ACTIVITY:  You should plan to take it easy for the rest of today and you should NOT DRIVE or use heavy machinery until tomorrow (because of the sedation medicines used during the test).    FOLLOW UP: Our staff will call the number listed on your records the next business day following your procedure.  We will call around 7:15- 8:00 am to check on you and address any questions or concerns that you may have regarding the information given to you following your procedure. If we do not reach you, we will leave a message.     If any biopsies were taken you will be contacted by phone or by letter within the next 1-3 weeks.  Please call us  at [phone] if you have not heard about the biopsies in  3 weeks.    SIGNATURES/CONFIDENTIALITY: You and/or your care partner have signed paperwork which will be entered into your electronic medical record.  These signatures attest to the fact that that the information above on your After Visit Summary has been reviewed and is understood.  Full responsibility of the confidentiality of this discharge information lies with you and/or your care-partner.

## 2024-04-28 NOTE — Progress Notes (Unsigned)
 [facility] History and Physical   Primary Care Physician:  Sheldon Netter, PA   Reason for Procedure:  Epigastric pain  Plan:    EGD with possible interventions as needed     HPI: Angelica Campos is a very pleasant 18 y.o. female here for EGD for epigastric pain. Please refer to office visit note by Alan Coombs for details  The risks and benefits as well as alternatives of endoscopic procedure(s) have been discussed and reviewed.  The patient was provided an opportunity to ask questions and all were answered. The patient agreed with the plan and demonstrated an understanding of the instructions.   Past Medical History:  Diagnosis Date   Anxiety    Family history of adverse reaction to anesthesia    Mother had difficuly urinating after surgery as well PONV   Left ulnar fracture    Pneumonia    PONV (postoperative nausea and vomiting)    Seasonal allergies     Past Surgical History:  Procedure Laterality Date   MOUTH SURGERY     ORIF ULNAR FRACTURE Left 07/29/2016   Procedure: OPEN REDUCTION INTERNAL FIXATION (ORIF) ULNAR shaft with repair/reconstruction as needed;  Surgeon: Elsie Mussel, MD;  Location: [facility] OR;  Service: Orthopedics;  Laterality: Left;   TONSILLECTOMY      Prior to Admission medications  Medication Sig Start Date End Date Taking? Authorizing Provider  ibuprofen  (ADVIL ,MOTRIN ) 400 MG tablet Take 400 mg by mouth 3 (three) times daily.   Yes [provider]  minocycline (MINOCIN) 50 MG capsule Take 50 mg by mouth 2 (two) times daily. 04/07/24  Yes [provider]  omeprazole  (PRILOSEC) 40 MG capsule Take 1 capsule (40 mg total) by mouth daily before breakfast. 01/21/24  Yes Coombs Alan R, PA-C  ondansetron  (ZOFRAN -ODT) 8 MG disintegrating tablet Take 1 tablet (8 mg total) by mouth every 8 (eight) hours as needed for nausea or vomiting. 04/28/24  Yes Coombs Alan R, PA-C  adapalene (DIFFERIN) 0.1 % gel Apply 1 Application  topically at bedtime. 09/24/18   [provider]  albuterol (PROAIR HFA) 108 (90 Base) MCG/ACT inhaler Inhale 2 puffs into the lungs as needed. Patient not taking: Reported on 04/28/2024 08/21/16   [provider]  benzoyl peroxide (BREVOXYL) 4 % external liquid Apply 1 Application topically daily. 09/24/18   [provider]  cetirizine (ZYRTEC) 5 MG tablet Take 5 mg by mouth as needed. 12/16/15   [provider]  dicyclomine  (BENTYL ) 20 MG tablet Take 1 tablet (20 mg total) by mouth 3 (three) times daily as needed for spasms. 04/28/24   Coombs Alan SAUNDERS, PA-C  drospirenone-ethinyl estradiol (YAZ) 3-0.02 MG tablet Take 1 tablet by mouth daily. 11/29/23   [provider]  escitalopram (LEXAPRO) 10 MG tablet Take 1 tablet by mouth daily. 06/13/21   [provider]  fluticasone (FLONASE) 50 MCG/ACT nasal spray Place 1 spray into both nostrils as needed. 03/09/21   [provider]  metoCLOPramide  (REGLAN ) 10 MG tablet Take 1 tablet (10 mg total) by mouth every 6 (six) hours as needed for up to 7 days for nausea (fullness). 04/08/24 04/15/24  Coombs Alan SAUNDERS, PA-C    Current Outpatient Medications  Medication Sig Dispense Refill   ibuprofen  (ADVIL ,MOTRIN ) 400 MG tablet Take 400 mg by mouth 3 (three) times daily.     minocycline (MINOCIN) 50 MG capsule Take 50 mg by mouth 2 (two) times daily.     omeprazole  (PRILOSEC) 40 MG capsule Take 1  capsule (40 mg total) by mouth daily before breakfast. 90 capsule 0   ondansetron  (ZOFRAN -ODT) 8 MG disintegrating tablet Take 1 tablet (8 mg total) by mouth every 8 (eight) hours as needed for nausea or vomiting. 30 tablet 0   adapalene (DIFFERIN) 0.1 % gel Apply 1 Application topically at bedtime.     albuterol (PROAIR HFA) 108 (90 Base) MCG/ACT inhaler Inhale 2 puffs into the lungs as needed. (Patient not taking: Reported on 04/28/2024)     benzoyl peroxide (BREVOXYL) 4 % external liquid Apply 1 Application  topically daily.     cetirizine (ZYRTEC) 5 MG tablet Take 5 mg by mouth as needed.     dicyclomine  (BENTYL ) 20 MG tablet Take 1 tablet (20 mg total) by mouth 3 (three) times daily as needed for spasms. 50 tablet 0   drospirenone-ethinyl estradiol (YAZ) 3-0.02 MG tablet Take 1 tablet by mouth daily.     escitalopram (LEXAPRO) 10 MG tablet Take 1 tablet by mouth daily.     fluticasone (FLONASE) 50 MCG/ACT nasal spray Place 1 spray into both nostrils as needed.     metoCLOPramide  (REGLAN ) 10 MG tablet Take 1 tablet (10 mg total) by mouth every 6 (six) hours as needed for up to 7 days for nausea (fullness). 20 tablet 0   No current facility-administered medications for this visit.    Allergies as of 04/28/2024 - Review Complete 04/28/2024  Allergen Reaction Noted   Azithromycin Nausea And Vomiting and Nausea Only 03/31/2020    Family History  Problem Relation Age of Onset   Arthritis Mother    Colon cancer Maternal Uncle    Colon cancer Maternal Grandmother    Arthritis Maternal Grandfather     Social History   Socioeconomic History   Marital status: Single    Spouse name: Not on file   Number of children: Not on file   Years of education: Not on file   Highest education level: Not on file  Occupational History   Not on file  Tobacco Use   Smoking status: Never   Smokeless tobacco: Never  Vaping Use   Vaping status: Never Used  Substance and Sexual Activity   Alcohol use: No   Drug use: No   Sexual activity: Never  Other Topics Concern   Not on file  Social History Narrative   Pt is in the 6th grade 2017-18 and lives at home with parents and sibling.   Social Drivers of Health   Tobacco Use: Low Risk (04/28/2024)   Patient History    Smoking Tobacco Use: Never    Smokeless Tobacco Use: Never    Passive Exposure: Not on file  Financial Resource Strain: Not on file  Food Insecurity: Not on file  Transportation Needs: Not on file  Physical Activity: Not on file   Stress: Not on file  Social Connections: Not on file  Intimate Partner Violence: Not on file  Depression (EYV7-0): Not on file  Alcohol Screen: Not on file  Housing: Not on file  Utilities: Not on file  Health Literacy: Not on file    Review of Systems:  All other review of systems negative except as mentioned in the HPI.  Physical Exam: Vital signs in last 24 hours: Temp 98.4 F (36.9 C)   LMP 04/05/2024  General:   Alert, NAD Lungs:  Clear .   Heart:  Regular rate and rhythm Abdomen:  Soft, nontender and nondistended. Neuro/Psych:  Alert and cooperative. Normal mood and affect. A  and O x 3  Reviewed labs, radiology imaging, old records and pertinent past GI work up  Patient is appropriate for planned procedure(s) and anesthesia in an ambulatory setting   K. Veena Toniesha Zellner , MD [phone]

## 2024-04-28 NOTE — Progress Notes (Signed)
 Pt's states no medical or surgical changes since previsit or office visit.

## 2024-04-28 NOTE — Telephone Encounter (Signed)
 Pt and pt mom Valeri made aware of Alan Coombs PA recommendations. EGD was scheduled for today at 3:00 PM with Dr. Shila  Pt and pt mom made aware.  Ambulatory referral to GI placed in Epic.  Prep instructions were sent to pt via my chart. Pt and pt mom made aware.  Separate secure chat sent to PA team to ensure insurance approval.  Pt  and pt mom verbalized understanding with all questions answered.   Routed as FYI

## 2024-04-28 NOTE — Op Note (Signed)
 [facility] Patient Name: Angelica Campos Procedure Date: 04/28/2024 3:13 PM MRN: [id number] Endoscopist: Gustav ALONSO Mcgee , MD, [id number] Age: 18 Referring MD:  Date of Birth: July 02, 2005 Gender: Female Account #: [id number] Procedure:                Upper GI endoscopy Indications:              Epigastric abdominal pain Medicines:                Monitored Anesthesia Care Procedure:                Pre-Anesthesia Assessment:                           - Prior to the procedure, a History and Physical                            was performed, and patient medications and                            allergies were reviewed. The patient's tolerance of                            previous anesthesia was also reviewed. The risks                            and benefits of the procedure and the sedation                            options and risks were discussed with the patient.                            All questions were answered, and informed consent                            was obtained. Prior Anticoagulants: The patient has                            taken no anticoagulant or antiplatelet agents. ASA                            Grade Assessment: I - A normal, healthy patient.                            After reviewing the risks and benefits, the patient                            was deemed in satisfactory condition to undergo the                            procedure.                           After obtaining informed consent, the endoscope was  passed under direct vision. Throughout the                            procedure, the patient's blood pressure, pulse, and                            oxygen saturations were monitored continuously. The                            GIF [id number] #[id number] was introduced through the                            mouth, and advanced to the second part of duodenum.                            The upper GI endoscopy was  accomplished without                            difficulty. The patient tolerated the procedure                            well. Scope In: Scope Out: Findings:                 The Z-line was regular and was found 38 cm from the                            incisors.                           A 2 cm hiatal hernia was present.                           The stomach was normal.                           The cardia and gastric fundus were normal on                            retroflexion.                           The first portion of the duodenum and second                            portion of the duodenum were normal. Biopsies for                            histology were taken with a cold forceps for                            evaluation of celiac disease. Complications:            No immediate complications. Estimated Blood Loss:     Estimated blood loss was minimal. Impression:               -  Z-line regular, 38 cm from the incisors.                           - 2 cm hiatal hernia.                           - Normal stomach.                           - Normal first portion of the duodenum and second                            portion of the duodenum. Biopsied. Recommendation:           - Resume previous diet.                           - Continue present medications.                           - Await pathology results.                           - Follow an antireflux regimen.                           - Follow up in GI office as scheduled Angelica Crumbley V. Kathalina Ostermann, MD 04/28/2024 4:02:10 PM This report has been signed electronically.

## 2024-04-28 NOTE — Progress Notes (Unsigned)
 Called to room to assist during endoscopic procedure.  Patient ID and intended procedure confirmed with present staff. Received instructions for my participation in the procedure from the performing physician.

## 2024-04-28 NOTE — Progress Notes (Signed)
 Transferred to PACU via stretcher.  Not responding to stimulation at this time.  VSS upon leaving procedure room.

## 2024-04-29 ENCOUNTER — Telehealth: Payer: Self-pay

## 2024-04-29 ENCOUNTER — Encounter: Payer: Self-pay | Admitting: Gastroenterology

## 2024-04-29 NOTE — Telephone Encounter (Signed)
 Message was received from Angelica Campos stating that the HIDA scan is out of network for the pt and that the pt may have the HIDA scan done at [facility] or [facility]. Pt mom Angelica Campos was made aware.  Pt mom Angelica Campos provided with the number to Radiology to reschedule.  Angelica Campos  verbalized understanding with all questions answered.

## 2024-04-29 NOTE — Telephone Encounter (Signed)
 Please schedule HIDA. CT will be low yield. Possible functional disorder

## 2024-04-29 NOTE — Addendum Note (Signed)
 Addended by: CRAIG PALMA on: 04/29/2024 07:45 AM   Modules accepted: Orders

## 2024-04-29 NOTE — Telephone Encounter (Signed)
" °  Follow up Call-     04/28/2024    3:00 PM  Call back number  Post procedure Call Back phone  # [phone]  Permission to leave phone message Yes     Patient questions:  Do you have a fever, pain , or abdominal swelling? No. Pain Score  0 *  Have you tolerated food without any problems? Yes.    Have you been able to return to your normal activities? Yes.    Do you have any questions about your discharge instructions: Diet   No. Medications  No. Follow up visit  No.  Do you have questions or concerns about your Care? No.  Actions: * If pain score is 4 or above: No action needed, pain <4.   "

## 2024-04-29 NOTE — Telephone Encounter (Signed)
 Pt was scheduled for the HIDA scan 05/07/2024 at 8:30 AM at [facility] in [place]. Nothing to eat or drink past midnight. Berwyn made aware.  Pt mom Berwyn made aware. Radiology number was provided [phone] to call to inquire of cancellations prior to 05/07/2024. Berwyn verbalized understanding with all questions answered.

## 2024-04-30 ENCOUNTER — Encounter (HOSPITAL_COMMUNITY)
Admission: RE | Admit: 2024-04-30 | Discharge: 2024-04-30 | Disposition: A | Discharge status: Home / Self Care | Source: Ambulatory Visit | Attending: Physician Assistant | Admitting: Physician Assistant

## 2024-04-30 ENCOUNTER — Ambulatory Visit: Payer: Self-pay | Admitting: Physician Assistant

## 2024-04-30 ENCOUNTER — Other Ambulatory Visit: Payer: Self-pay | Admitting: Physician Assistant

## 2024-04-30 ENCOUNTER — Encounter: Payer: Self-pay | Admitting: Physician Assistant

## 2024-04-30 DIAGNOSIS — R1013 Epigastric pain: Secondary | ICD-10-CM | POA: Insufficient documentation

## 2024-04-30 DIAGNOSIS — R112 Nausea with vomiting, unspecified: Secondary | ICD-10-CM

## 2024-04-30 DIAGNOSIS — R1011 Right upper quadrant pain: Secondary | ICD-10-CM | POA: Diagnosis not present

## 2024-04-30 MED ORDER — TECHNETIUM TC 99M MEBROFENIN IV KIT
5.0000 | PACK | Freq: Once | INTRAVENOUS | Status: AC | PRN
  Administered 2024-04-30: 5 via INTRAVENOUS

## 2024-05-05 ENCOUNTER — Ambulatory Visit: Payer: Self-pay | Admitting: Physician Assistant

## 2024-05-05 ENCOUNTER — Ambulatory Visit
Admission: RE | Admit: 2024-05-05 | Discharge: 2024-05-05 | Disposition: A | Source: Ambulatory Visit | Attending: Physician Assistant | Admitting: Physician Assistant

## 2024-05-05 DIAGNOSIS — R112 Nausea with vomiting, unspecified: Secondary | ICD-10-CM | POA: Diagnosis not present

## 2024-05-05 DIAGNOSIS — R1110 Vomiting, unspecified: Secondary | ICD-10-CM | POA: Diagnosis not present

## 2024-05-05 DIAGNOSIS — R1013 Epigastric pain: Secondary | ICD-10-CM

## 2024-05-05 LAB — SURGICAL PATHOLOGY

## 2024-05-07 ENCOUNTER — Other Ambulatory Visit

## 2024-06-05 ENCOUNTER — Ambulatory Visit: Payer: Self-pay | Admitting: Gastroenterology
